# Patient Record
Sex: Male | Born: 1965 | Race: White | Hispanic: No | Marital: Married | Smoking: Never smoker
Health system: Southern US, Community
[De-identification: ages and names within clinical notes are randomized; demographics above are authoritative.]

## PROBLEM LIST (undated history)

## (undated) DIAGNOSIS — I10 Essential (primary) hypertension: Secondary | ICD-10-CM

---

## 2008-05-12 ENCOUNTER — Emergency Department (HOSPITAL_BASED_OUTPATIENT_CLINIC_OR_DEPARTMENT_OTHER): Admission: EM | Admit: 2008-05-12 | Discharge: 2008-05-12 | Payer: Self-pay | Admitting: Emergency Medicine

## 2008-05-12 ENCOUNTER — Ambulatory Visit: Payer: Self-pay | Admitting: Diagnostic Radiology

## 2009-07-11 IMAGING — CR DG CHEST 2V
2 series · 2 of 2 positions shown · non-contrast
Comparison: None available.

CLINICAL DATA: Flu symptoms.

CHEST - 2 VIEW

[w chest pa]
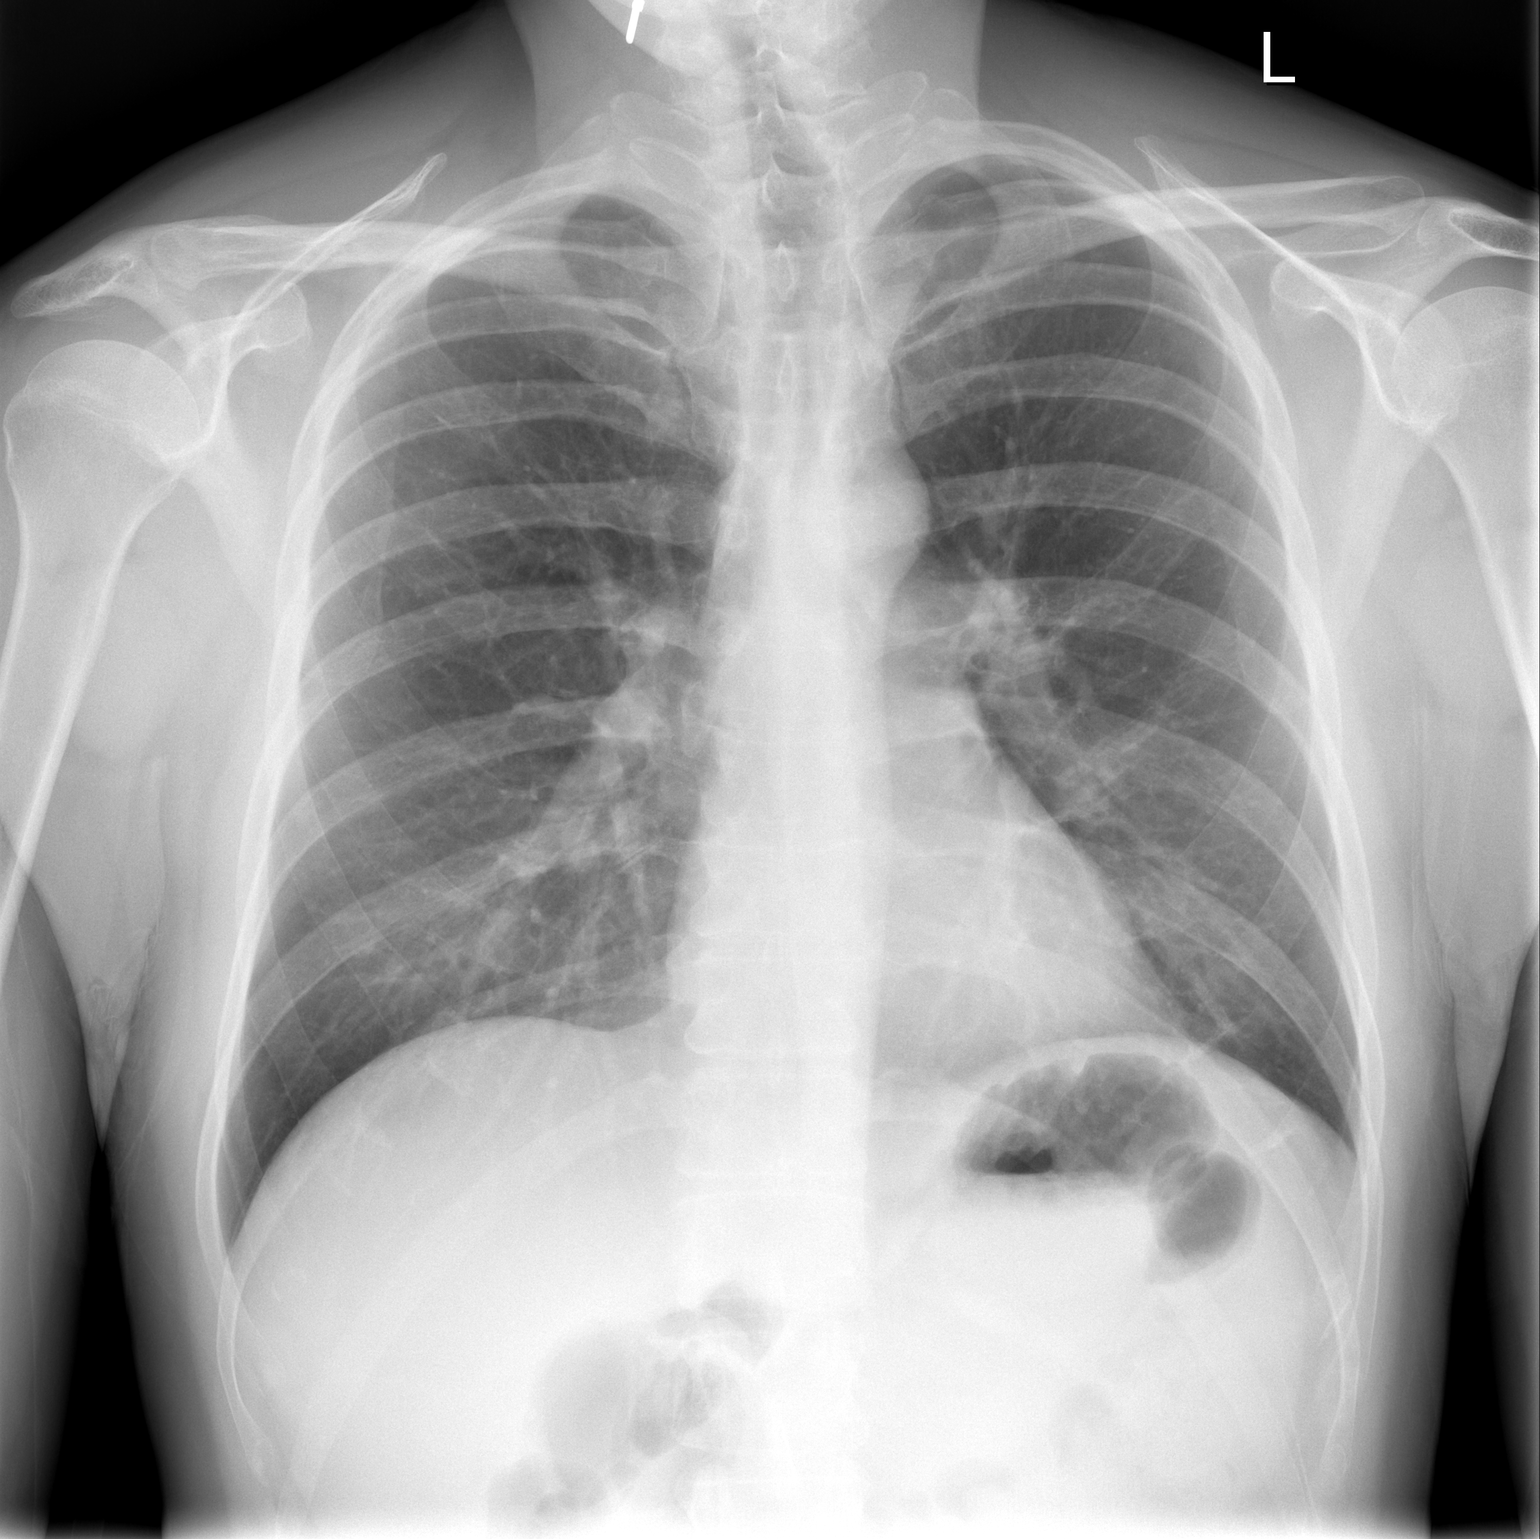

[w chest lat]
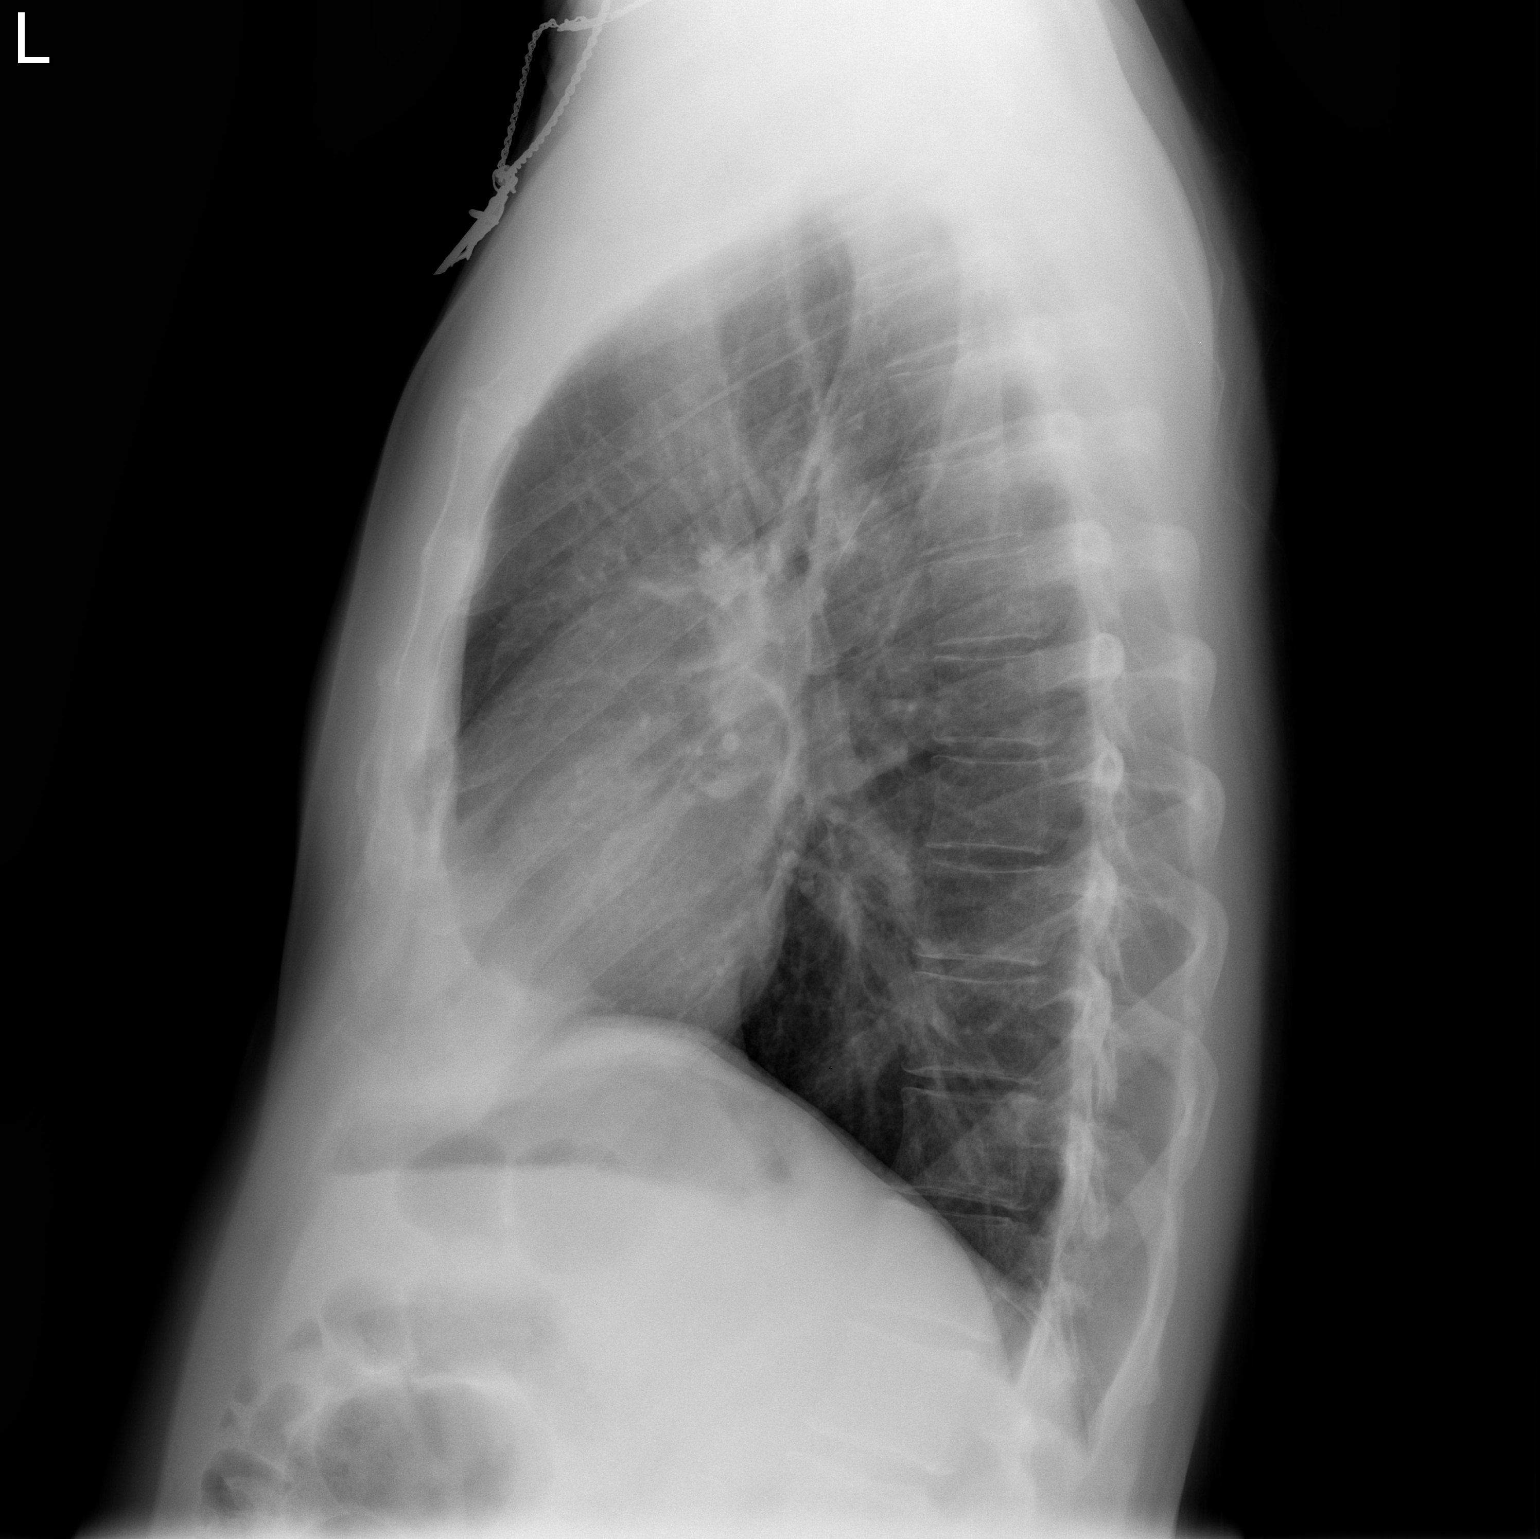

[2 of 2 positions shown; findings below may reference images not displayed]

FINDINGS: Lungs clear.  Heart size normal.  No pleural effusion or
focal bony abnormality.
IMPRESSION: No acute disease.

## 2020-05-26 ENCOUNTER — Other Ambulatory Visit (HOSPITAL_BASED_OUTPATIENT_CLINIC_OR_DEPARTMENT_OTHER): Payer: Self-pay | Admitting: Internal Medicine

## 2020-05-26 ENCOUNTER — Ambulatory Visit: Payer: Self-pay | Attending: Internal Medicine

## 2020-05-26 DIAGNOSIS — Z23 Encounter for immunization: Secondary | ICD-10-CM

## 2020-05-26 NOTE — Progress Notes (Signed)
° °  Covid-19 Vaccination Clinic  Name:  Hosteen Kienast    MRN: [id number] DOB: [birth year]  05/26/2020  Mr. [place] was observed post Covid-19 immunization for 15 minutes without incident. He was provided with Vaccine Information Sheet and instruction to access the V-Safe system.  Vaccinated by Fredirick Maudlin  Mr. Donald was instructed to call 911 with any severe reactions post vaccine:  Difficulty breathing   Swelling of face and throat   A fast heartbeat   A bad rash all over body   Dizziness and weakness   Immunizations Administered    Name Date Dose VIS Date Route   Pfizer COVID-19 Vaccine 05/26/2020  2:07 PM 0.3 mL 03/18/2020 Intramuscular   Manufacturer: [employer], [employer]   Lot: [id number]   NDC: [address]-7

## 2020-05-27 MED FILL — PFIZER-BIONTECH COVID-19 VA: 30 | 21 days supply | Qty: 0 | Fill #0

## 2021-08-18 ENCOUNTER — Emergency Department (HOSPITAL_BASED_OUTPATIENT_CLINIC_OR_DEPARTMENT_OTHER): Payer: BC Managed Care – PPO

## 2021-08-18 ENCOUNTER — Emergency Department (HOSPITAL_BASED_OUTPATIENT_CLINIC_OR_DEPARTMENT_OTHER)
Admission: EM | Admit: 2021-08-18 | Discharge: 2021-08-18 | Disposition: A | Discharge status: Home / Self Care | Payer: BC Managed Care – PPO | Attending: Emergency Medicine | Admitting: Emergency Medicine

## 2021-08-18 ENCOUNTER — Other Ambulatory Visit: Payer: Self-pay

## 2021-08-18 ENCOUNTER — Encounter (HOSPITAL_BASED_OUTPATIENT_CLINIC_OR_DEPARTMENT_OTHER): Payer: Self-pay | Admitting: *Deleted

## 2021-08-18 DIAGNOSIS — R1032 Left lower quadrant pain: Secondary | ICD-10-CM | POA: Diagnosis present

## 2021-08-18 DIAGNOSIS — N200 Calculus of kidney: Secondary | ICD-10-CM

## 2021-08-18 DIAGNOSIS — N132 Hydronephrosis with renal and ureteral calculous obstruction: Secondary | ICD-10-CM | POA: Insufficient documentation

## 2021-08-18 DIAGNOSIS — R7989 Other specified abnormal findings of blood chemistry: Secondary | ICD-10-CM | POA: Insufficient documentation

## 2021-08-18 HISTORY — DX: Essential (primary) hypertension: I10

## 2021-08-18 LAB — CBC WITH DIFFERENTIAL/PLATELET
Abs Immature Granulocytes: 0.02 10*3/uL (ref 0.00–0.07)
Basophils Absolute: 0 10*3/uL (ref 0.0–0.1)
Basophils Relative: 0 %
Eosinophils Absolute: 0.1 10*3/uL (ref 0.0–0.5)
Eosinophils Relative: 1 %
HCT: 43.3 % (ref 39.0–52.0)
Hemoglobin: 14 g/dL (ref 13.0–17.0)
Immature Granulocytes: 0 %
Lymphocytes Relative: 28 %
Lymphs Abs: 2.2 10*3/uL (ref 0.7–4.0)
MCH: 28.9 pg (ref 26.0–34.0)
MCHC: 32.3 g/dL (ref 30.0–36.0)
MCV: 89.3 fL (ref 80.0–100.0)
Monocytes Absolute: 0.5 10*3/uL (ref 0.1–1.0)
Monocytes Relative: 7 %
Neutro Abs: 5 10*3/uL (ref 1.7–7.7)
Neutrophils Relative %: 64 %
Platelets: 236 10*3/uL (ref 150–400)
RBC: 4.85 MIL/uL (ref 4.22–5.81)
RDW: 12.9 % (ref 11.5–15.5)
WBC: 7.8 10*3/uL (ref 4.0–10.5)
nRBC: 0 % (ref 0.0–0.2)

## 2021-08-18 LAB — HEPATIC FUNCTION PANEL
ALT: 16 U/L (ref 0–44)
AST: 15 U/L (ref 15–41)
Albumin: 3.7 g/dL (ref 3.5–5.0)
Alkaline Phosphatase: 49 U/L (ref 38–126)
Bilirubin, Direct: <0.1 mg/dL (ref 0.0–0.2)
Total Bilirubin: 0.5 mg/dL (ref 0.3–1.2)
Total Protein: 7.1 g/dL (ref 6.5–8.1)

## 2021-08-18 LAB — BASIC METABOLIC PANEL
Anion gap: 6 (ref 5–15)
BUN: 19 mg/dL (ref 6–20)
CO2: 26 mmol/L (ref 22–32)
Calcium: 8.8 mg/dL — ABNORMAL LOW (ref 8.9–10.3)
Chloride: 108 mmol/L (ref 98–111)
Creatinine, Ser: 1.27 mg/dL — ABNORMAL HIGH (ref 0.61–1.24)
GFR, Estimated: >60 mL/min (ref >60)
Glucose, Bld: 121 mg/dL — ABNORMAL HIGH (ref 70–99)
Potassium: 4 mmol/L (ref 3.5–5.1)
Sodium: 140 mmol/L (ref 135–145)

## 2021-08-18 LAB — URINALYSIS, ROUTINE W REFLEX MICROSCOPIC
Bilirubin Urine: NEGATIVE
Glucose, UA: NEGATIVE mg/dL
Ketones, ur: NEGATIVE mg/dL
Leukocytes,Ua: NEGATIVE
Nitrite: NEGATIVE
Protein, ur: 100 mg/dL — AB
Specific Gravity, Urine: >=1.03 (ref 1.005–1.030)
pH: 5 (ref 5.0–8.0)

## 2021-08-18 LAB — URINALYSIS, MICROSCOPIC (REFLEX): RBC / HPF: >50 RBC/hpf (ref 0–5)

## 2021-08-18 LAB — LIPASE, BLOOD: Lipase: 35 U/L (ref 11–51)

## 2021-08-18 MED ORDER — HYDROMORPHONE HCL 1 MG/ML IJ SOLN
1.0000 mg | Freq: Once | INTRAMUSCULAR | Status: AC
  Administered 2021-08-18: 1 mg via INTRAVENOUS
  Filled 2021-08-18: qty 1

## 2021-08-18 MED ORDER — ONDANSETRON 4 MG PO TBDP: 4.0000 mg | ORAL_TABLET | Freq: Three times a day (TID) | ORAL | 0 refills | Status: AC | PRN

## 2021-08-18 MED ORDER — OXYCODONE-ACETAMINOPHEN 5-325 MG PO TABS: 1.0000 | ORAL_TABLET | Freq: Three times a day (TID) | ORAL | 0 refills | Status: AC | PRN

## 2021-08-18 MED ORDER — TAMSULOSIN HCL 0.4 MG PO CAPS: 0.4000 mg | ORAL_CAPSULE | Freq: Every day | ORAL | 0 refills | Status: AC

## 2021-08-18 MED ORDER — MORPHINE SULFATE (PF) 4 MG/ML IV SOLN
4.0000 mg | Freq: Once | INTRAVENOUS | Status: AC
  Administered 2021-08-18: 4 mg via INTRAVENOUS
  Filled 2021-08-18: qty 1

## 2021-08-18 MED ORDER — ONDANSETRON HCL 4 MG/2ML IJ SOLN
4.0000 mg | Freq: Once | INTRAMUSCULAR | Status: AC
  Administered 2021-08-18: 4 mg via INTRAVENOUS
  Filled 2021-08-18: qty 2

## 2021-08-18 MED ORDER — SODIUM CHLORIDE 0.9 % IV BOLUS
1000.0000 mL | Freq: Once | INTRAVENOUS | Status: AC
  Administered 2021-08-18: 1000 mL via INTRAVENOUS

## 2021-08-18 NOTE — Discharge Instructions (Addendum)
Your pain is likely caused by the kidney stone seen on your CT scan today. ?Take the medications as prescribed to help with your symptoms. ?If your symptoms do not improve in 4 to 5 days you will need to follow-up with urologist listed below. ?Return to the ER if you start to experience worsening symptoms, if you develop a fever, chest pain, shortness of breath or lightheadedness ?

## 2021-08-18 NOTE — ED Provider Notes (Signed)
?[facility] EMERGENCY DEPARTMENT ?Provider Note ? ? ?CSN: [id number] ?Arrival date & time: 08/18/21  [id number] ? ?  ? ?History ? ?Chief Complaint  ?Patient presents with  ? Abdominal Pain  ? ? ?Douglas Holden is a 56 y.o. male presenting to the ED with a chief complaint of left lower quadrant and left-sided back pain.  Symptoms began around 6 AM and woke him up from his sleep today.  He reports sharp pain with associated slightly looser stools, nausea, and episode of nonbloody, nonbilious emesis after attempting to take Gas-X this morning.  He has not experienced symptoms like this in the past.  No sick contacts with similar symptoms or suspicious food intake.  He initially thought it was gas pain which is why he took the medication but vomited after this.  He denies any dysuria or other urinary symptoms.  Prior abdominal surgeries include hernia repair over a decade ago.  Denies any chest pain, shortness of breath, fever ? ? ?Abdominal Pain ?Associated symptoms: nausea and vomiting   ?Associated symptoms: no chest pain, no chills, no constipation, no cough, no diarrhea, no dysuria, no fever, no hematuria, no shortness of breath and no sore throat   ? ?  ? ?Home Medications ?Prior to Admission medications   ?Medication Sig Start Date End Date Taking? Authorizing Provider  ?ondansetron (ZOFRAN-ODT) 4 MG disintegrating tablet Take 1 tablet (4 mg total) by mouth every 8 (eight) hours as needed for nausea or vomiting. 08/18/21  Yes Rebie Peale, PA-C  ?oxyCODONE-acetaminophen (PERCOCET/ROXICET) 5-325 MG tablet Take 1 tablet by mouth every 8 (eight) hours as needed for severe pain. 08/18/21  Yes Dajanay Northrup, PA-C  ?rosuvastatin (CRESTOR) 10 MG tablet Take 10 mg by mouth daily.   Yes [provider]  ?tamsulosin (FLOMAX) 0.4 MG CAPS capsule Take 1 capsule (0.4 mg total) by mouth daily after supper. 08/18/21  Yes Dessiree Sze, PA-C  ?   ? ?Allergies    ?Patient has no known allergies.   ? ?Review of Systems    ?Review of Systems  ?Constitutional:  Negative for appetite change, chills and fever.  ?HENT:  Negative for ear pain, rhinorrhea, sneezing and sore throat.   ?Eyes:  Negative for photophobia and visual disturbance.  ?Respiratory:  Negative for cough, chest tightness, shortness of breath and wheezing.   ?Cardiovascular:  Negative for chest pain and palpitations.  ?Gastrointestinal:  Positive for abdominal pain, nausea and vomiting. Negative for blood in stool, constipation and diarrhea.  ?Genitourinary:  Negative for dysuria, hematuria and urgency.  ?Musculoskeletal:  Negative for myalgias.  ?Skin:  Negative for rash.  ?Neurological:  Negative for dizziness, weakness and light-headedness.  ? ?Physical Exam ?Updated Vital Signs ?BP (!) 180/96 (BP Location: Right Arm)   Pulse (!) 50   Temp 97.6 ?F (36.4 ?C) (Oral)   Resp 20   Ht 5\' 11"  (1.803 m)   Wt 88.5 kg   SpO2 98%   BMI 27.20 kg/m?  ?Physical Exam ?Vitals and nursing note reviewed.  ?Constitutional:   ?   General: He is not in acute distress. ?   Appearance: He is well-developed.  ?HENT:  ?   Head: Normocephalic and atraumatic.  ?   Nose: Nose normal.  ?Eyes:  ?   General: No scleral icterus.    ?   Left eye: No discharge.  ?   Conjunctiva/sclera: Conjunctivae normal.  ?Cardiovascular:  ?   Rate and Rhythm: Normal rate and regular rhythm.  ?  Heart sounds: Normal heart sounds. No murmur heard. ?  No friction rub. No gallop.  ?Pulmonary:  ?   Effort: Pulmonary effort is normal. No respiratory distress.  ?   Breath sounds: Normal breath sounds.  ?Abdominal:  ?   General: Bowel sounds are normal. There is no distension.  ?   Palpations: Abdomen is soft.  ?   Tenderness: There is abdominal tenderness in the left lower quadrant. There is left CVA tenderness. There is no guarding.  ?Musculoskeletal:     ?   General: Normal range of motion.  ?   Cervical back: Normal range of motion and neck supple.  ?Skin: ?   General: Skin is warm and dry.  ?   Findings: No  rash.  ?Neurological:  ?   Mental Status: He is alert.  ?   Motor: No abnormal muscle tone.  ?   Coordination: Coordination normal.  ? ? ?ED Results / Procedures / Treatments   ?Labs ?(all labs ordered are listed, but only abnormal results are displayed) ?Labs Reviewed  ?BASIC METABOLIC PANEL - Abnormal; Notable for the following components:  ?    Result Value  ? Glucose, Bld 121 (*)   ? Creatinine, Ser 1.27 (*)   ? Calcium 8.8 (*)   ? All other components within normal limits  ?URINALYSIS, ROUTINE W REFLEX MICROSCOPIC - Abnormal; Notable for the following components:  ? Color, Urine AMBER (*)   ? APPearance CLOUDY (*)   ? Hgb urine dipstick LARGE (*)   ? Protein, ur 100 (*)   ? All other components within normal limits  ?URINALYSIS, MICROSCOPIC (REFLEX) - Abnormal; Notable for the following components:  ? Bacteria, UA RARE (*)   ? All other components within normal limits  ?URINE CULTURE  ?CBC WITH DIFFERENTIAL/PLATELET  ?LIPASE, BLOOD  ?HEPATIC FUNCTION PANEL  ? ? ?EKG ?None ? ?Radiology ?CT Renal Stone Study ? ?Result Date: 08/18/2021 ?CLINICAL DATA:  Flank pain, kidney stone suspected EXAM: CT ABDOMEN AND PELVIS WITHOUT CONTRAST TECHNIQUE: Multidetector CT imaging of the abdomen and pelvis was performed following the standard protocol without IV contrast. RADIATION DOSE REDUCTION: This exam was performed according to the departmental dose-optimization program which includes automated exposure control, adjustment of the mA and/or kV according to patient size and/or use of iterative reconstruction technique. COMPARISON:  None. FINDINGS: Lower chest: No acute abnormality. Hepatobiliary: No focal liver abnormality is seen. The gallbladder is unremarkable. Pancreas: Unremarkable. No pancreatic ductal dilatation or surrounding inflammatory changes. Spleen: Normal in size without focal abnormality. Adrenals/Urinary Tract: The adrenal glands are unremarkable. There is left-sided hydroureteronephrosis with a 2-3 mm stone  identified in the distal left ureter (series 2, image 77, series 5, image 43). There is periureteral stranding. No right-sided hydronephrosis or nephrolithiasis. The bladder is mildly distended. Stomach/Bowel: The stomach is within normal limits. There is no evidence of bowel obstruction.The appendix is normal. Vascular/Lymphatic: Aortoiliac atherosclerotic calcifications. No AAA. No lymphadenopathy. Reproductive: Prostate calcifications. Other: Small fat containing inguinal hernias left greater than right. Small fat containing umbilical hernia. No bowel containing hernia. No ascites. No free air. Musculoskeletal: No acute osseous abnormality. No suspicious lytic or blastic lesions. Multilevel degenerative changes of the spine. 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 IMPRESSION: Obstructing 2-3 mm stone in the distal left ureter with upstream hydroureteronephrosis and periureteral stranding. Electronically Signed   By: Caprice Renshaw M.D.   On: 08/18/2021 10:14   ? ?Procedures ?Procedures  ? ? ?Medications Ordered in ED ?Medications  ?sodium chloride  0.9 % bolus 1,000 mL (0 mLs Intravenous Stopped 08/18/21 1028)  ?morphine (PF) 4 MG/ML injection 4 mg (4 mg Intravenous Given 08/18/21 0927)  ?ondansetron [facility]) injection 4 mg (4 mg Intravenous Given 08/18/21 0927)  ?HYDROmorphone (DILAUDID) injection 1 mg (1 mg Intravenous Given 08/18/21 1013)  ? ? ?ED Course/ Medical Decision Making/ A&P ?Clinical Course as of 08/18/21 1038  ?Wed Aug 18, 2021  ?0917 WBC: 7.8 [HK]  ?3818 Creatinine(!): 1.27 [HK]  ?0948 Hgb urine dipstick(!): LARGE [HK]  ?0948 RBC / HPF: >50 [HK]  ?  ?Clinical Course User Index ?[HK] Idelle Leech, Saman Giddens, PA-C  ? ?                        ?Medical Decision Making ?Amount and/or Complexity of Data Reviewed ?Labs: ordered. Decision-making details documented in ED Course. ?Radiology: ordered. ? ?Risk ?Prescription drug management. ? ? ?57 year old male presenting to the ED for left middle quadrant and left sided  back pain that began when he woke up this morning.  Has had some emesis associated with the pain after attempting to take Gas-X.  He denies any bloody emesis, bloody stools.  Prior abdominal surgeries include herni

## 2021-08-18 NOTE — ED Notes (Signed)
Pt in CT.

## 2021-08-18 NOTE — ED Triage Notes (Signed)
Acute onset of left lower quad abd pain, vomited x 1, states last night felt fine.  ?

## 2021-08-18 NOTE — ED Notes (Signed)
ED Provider at bedside. 

## 2021-08-19 LAB — URINE CULTURE: Culture: <10000 — AB

## 2022-10-17 IMAGING — CT CT RENAL STONE PROTOCOL
2 of 4 series · 15 of 46 positions shown, 17 images · non-contrast
Comparison: None.

CLINICAL DATA: Flank pain, kidney stone suspected



[Series 2: axial st · axial · 0.83mm/px · z∈[-600,-96]mm · 12 of 111 slices shown, 14 images]
[im 5/111  soft-tissue]
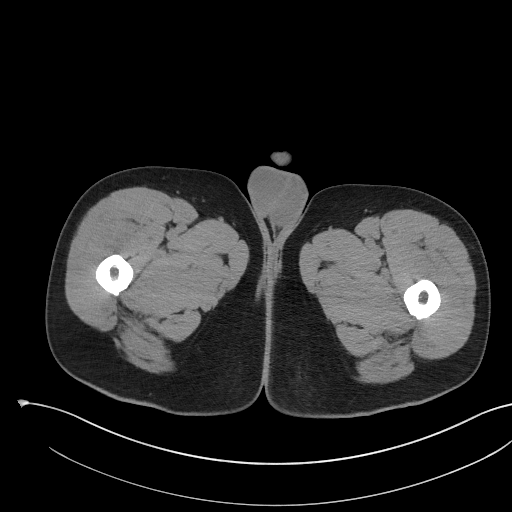
[im 5/111  bone]
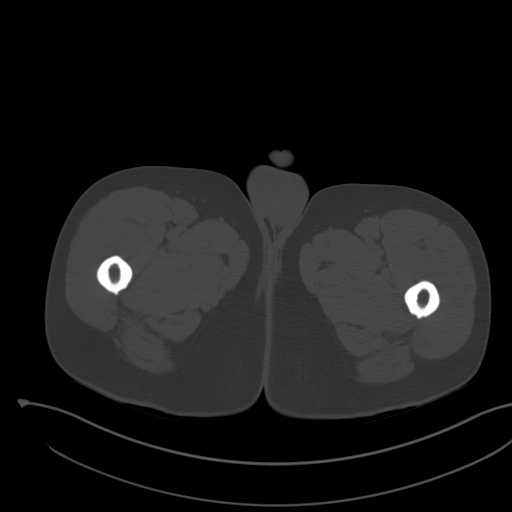
[im 15/111  soft-tissue]
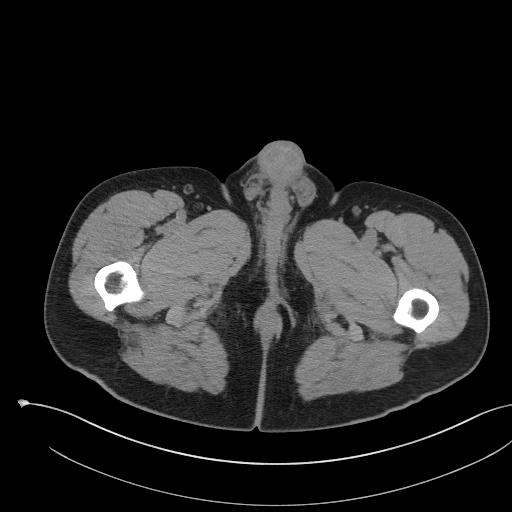
[im 24/111  soft-tissue]
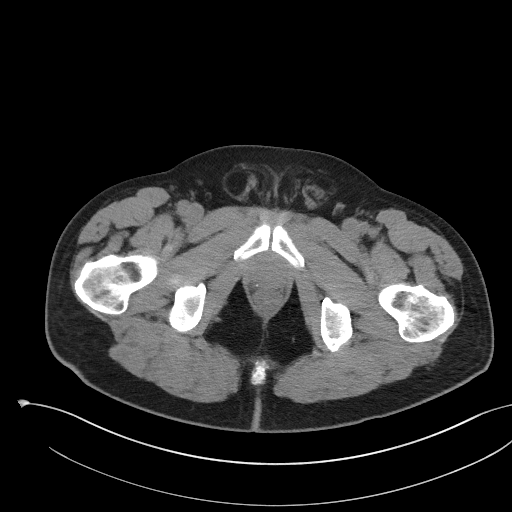
[im 34/111  soft-tissue]
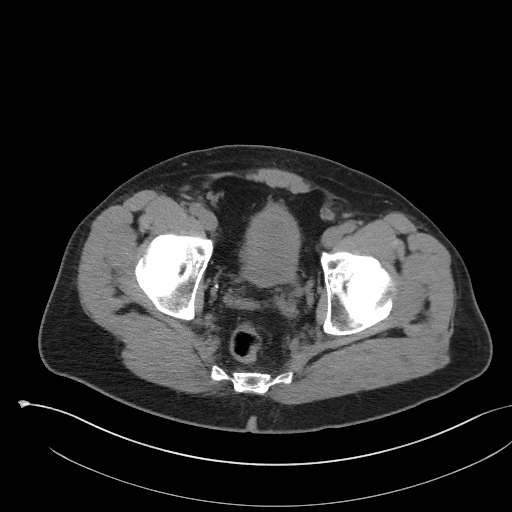
[im 44/111  soft-tissue]
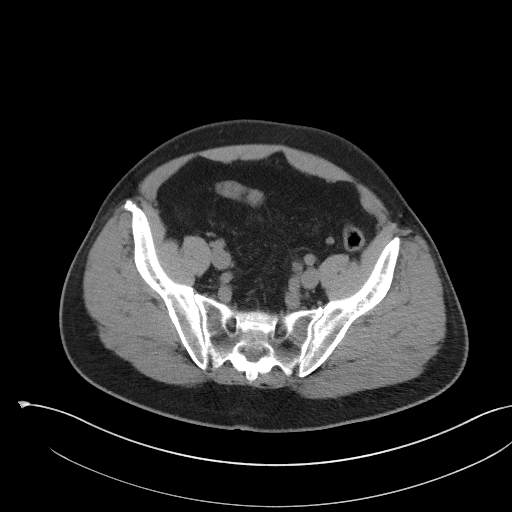
[im 53/111  soft-tissue]
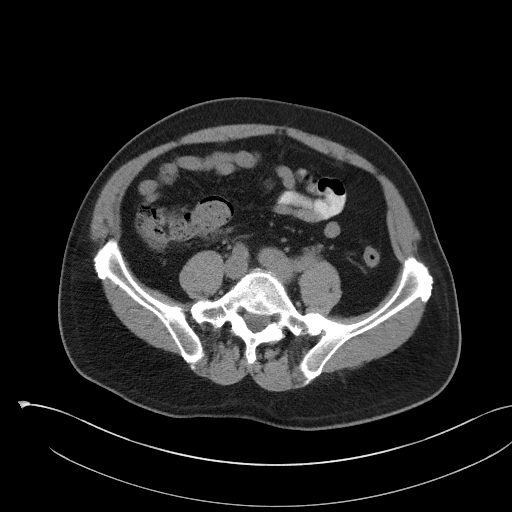
[im 58/111  soft-tissue]
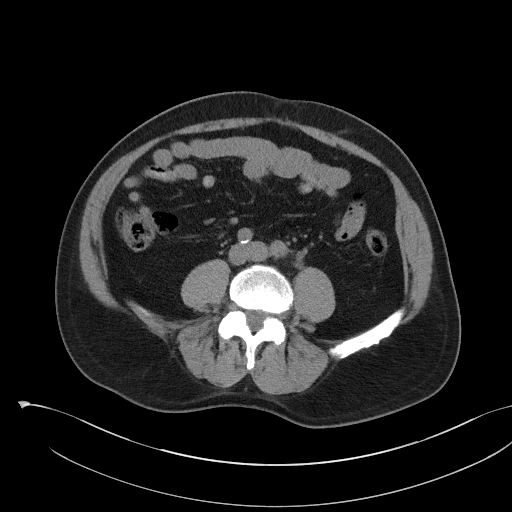
[im 67/111  soft-tissue]
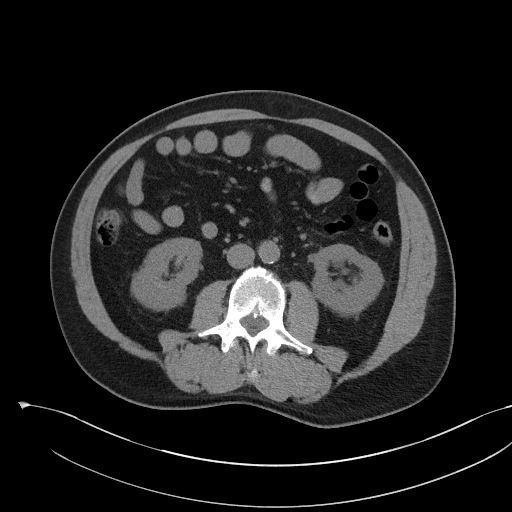
[im 77/111  soft-tissue]
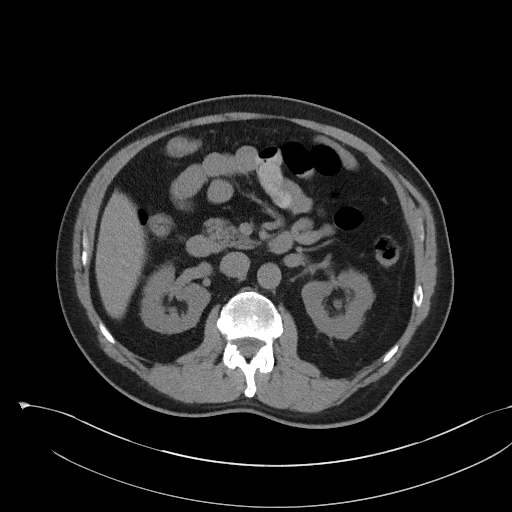
[im 77/111  bone]
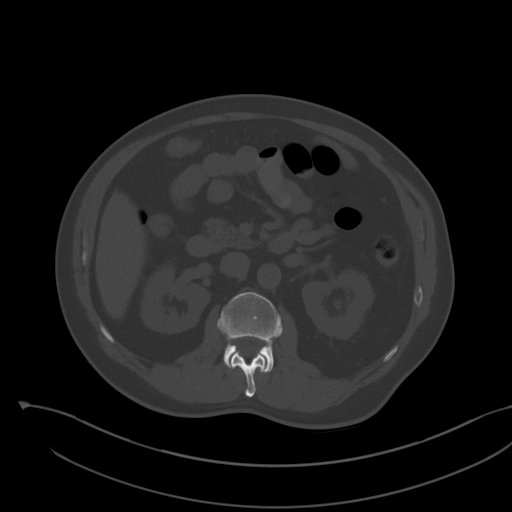
[im 87/111  soft-tissue]
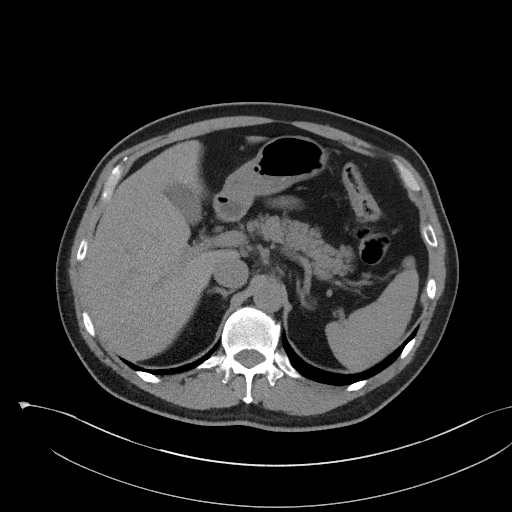
[im 96/111  soft-tissue]
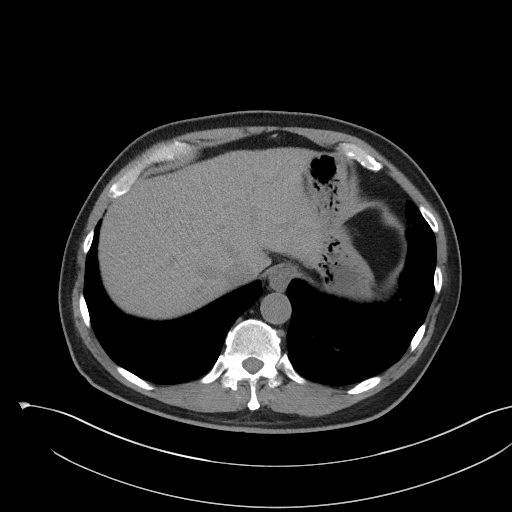
[im 106/111  soft-tissue]
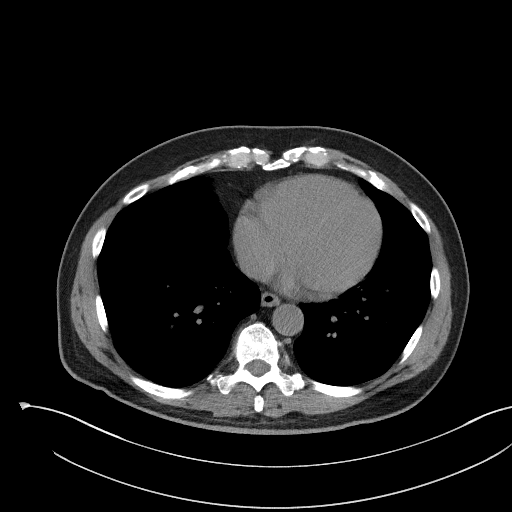

[Series 5: coronal st · coronal · 0.78mm/px · 3 of 101 slices shown]
[im 34/101  soft-tissue]
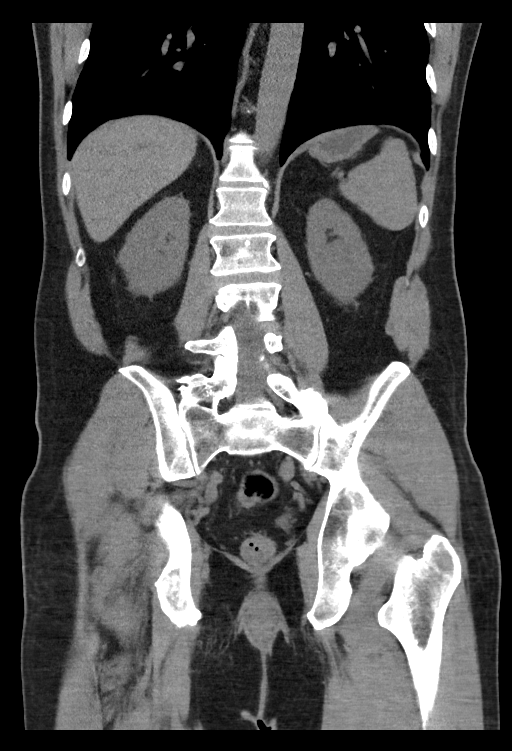
[im 45/101  soft-tissue]
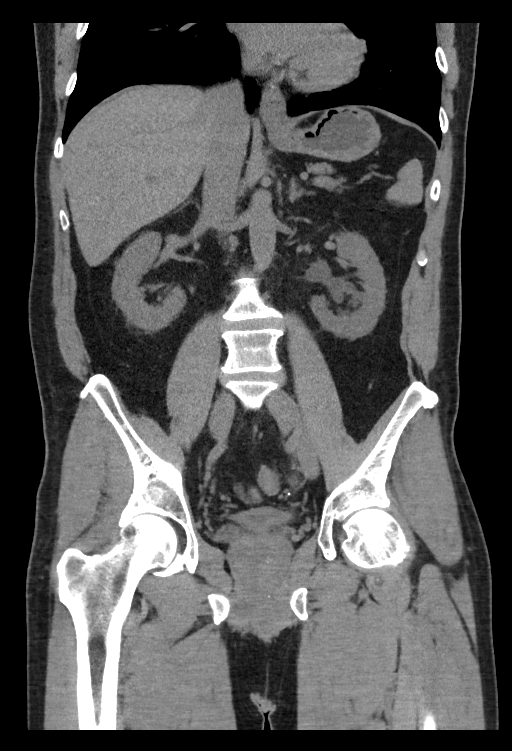
[im 56/101  soft-tissue]
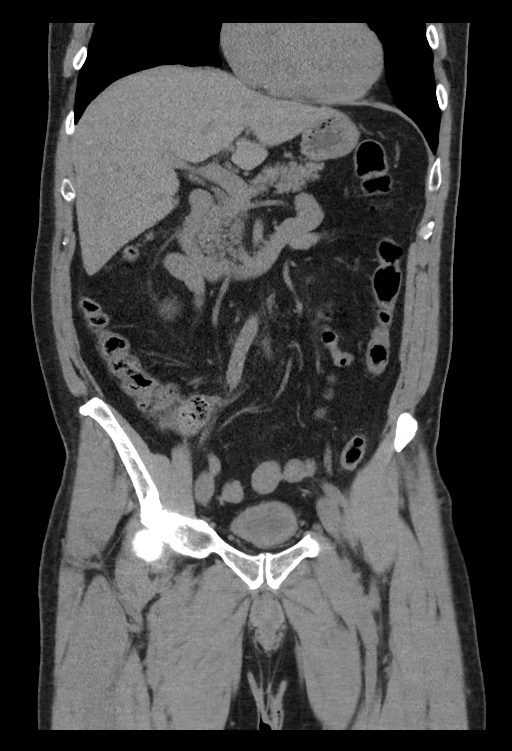

[15 of 46 positions shown; findings below may reference images not displayed]

FINDINGS: Lower chest: No acute abnormality.

Hepatobiliary: No focal liver abnormality is seen. The gallbladder
is unremarkable.

Pancreas: Unremarkable. No pancreatic ductal dilatation or
surrounding inflammatory changes.

Spleen: Normal in size without focal abnormality.

Adrenals/Urinary Tract: The adrenal glands are unremarkable. There
is left-sided hydroureteronephrosis with a 2-3 mm stone identified
in the distal left ureter (series 2, image 77, series 5, image 43).
There is periureteral stranding. No right-sided hydronephrosis or
nephrolithiasis. The bladder is mildly distended.

Stomach/Bowel: The stomach is within normal limits. There is no
evidence of bowel obstruction.The appendix is normal.

Vascular/Lymphatic: Aortoiliac atherosclerotic calcifications. No
AAA. No lymphadenopathy.

Reproductive: Prostate calcifications.

Other: Small fat containing inguinal hernias left greater than
right. Small fat containing umbilical hernia. No bowel containing
hernia. No ascites. No free air.

Musculoskeletal: No acute osseous abnormality. No suspicious lytic
or blastic lesions. Multilevel degenerative changes of the spine.
Mild-to-moderate bilateral hip osteoarthritis.
IMPRESSION: Obstructing 2-3 mm stone in the distal left ureter with upstream
hydroureteronephrosis and periureteral stranding.
